# Patient Record
Sex: Female | Born: 1970
Health system: Southern US, Community
[De-identification: ages and names within clinical notes are randomized; demographics above are authoritative.]

## PROBLEM LIST (undated history)

## (undated) DIAGNOSIS — F32A Depression, unspecified: Secondary | ICD-10-CM

## (undated) HISTORY — DX: Depression, unspecified: F32.A

---

## 1998-03-17 ENCOUNTER — Encounter: Payer: Self-pay | Admitting: Neurosurgery

## 1998-03-17 ENCOUNTER — Ambulatory Visit (HOSPITAL_COMMUNITY): Admission: RE | Admit: 1998-03-17 | Discharge: 1998-03-17 | Payer: Self-pay | Admitting: Neurosurgery

## 1998-06-17 ENCOUNTER — Other Ambulatory Visit: Admission: RE | Admit: 1998-06-17 | Discharge: 1998-06-17 | Payer: Self-pay | Admitting: *Deleted

## 2010-01-31 ENCOUNTER — Encounter: Admission: RE | Admit: 2010-01-31 | Discharge: 2010-01-31 | Payer: Self-pay | Admitting: Obstetrics and Gynecology

## 2011-02-19 ENCOUNTER — Other Ambulatory Visit: Payer: Self-pay | Admitting: Obstetrics and Gynecology

## 2011-02-19 DIAGNOSIS — Z1231 Encounter for screening mammogram for malignant neoplasm of breast: Secondary | ICD-10-CM

## 2011-03-22 ENCOUNTER — Ambulatory Visit: Payer: Self-pay

## 2011-03-27 ENCOUNTER — Ambulatory Visit
Admission: RE | Admit: 2011-03-27 | Discharge: 2011-03-27 | Disposition: A | Discharge status: Home / Self Care | Payer: 59 | Source: Ambulatory Visit | Attending: Obstetrics and Gynecology | Admitting: Obstetrics and Gynecology

## 2011-03-27 DIAGNOSIS — Z1231 Encounter for screening mammogram for malignant neoplasm of breast: Secondary | ICD-10-CM

## 2012-01-04 ENCOUNTER — Other Ambulatory Visit: Payer: Self-pay | Admitting: Obstetrics and Gynecology

## 2012-01-04 DIAGNOSIS — Z1231 Encounter for screening mammogram for malignant neoplasm of breast: Secondary | ICD-10-CM

## 2012-03-27 ENCOUNTER — Ambulatory Visit
Admission: RE | Admit: 2012-03-27 | Discharge: 2012-03-27 | Disposition: A | Discharge status: Home / Self Care | Payer: 59 | Source: Ambulatory Visit | Attending: Obstetrics and Gynecology | Admitting: Obstetrics and Gynecology

## 2012-03-27 DIAGNOSIS — Z1231 Encounter for screening mammogram for malignant neoplasm of breast: Secondary | ICD-10-CM

## 2013-03-11 ENCOUNTER — Other Ambulatory Visit: Payer: Self-pay

## 2013-03-11 DIAGNOSIS — Z1231 Encounter for screening mammogram for malignant neoplasm of breast: Secondary | ICD-10-CM

## 2013-03-30 ENCOUNTER — Ambulatory Visit
Admission: RE | Admit: 2013-03-30 | Discharge: 2013-03-30 | Disposition: A | Discharge status: Home / Self Care | Payer: 59 | Source: Ambulatory Visit

## 2013-03-30 DIAGNOSIS — Z1231 Encounter for screening mammogram for malignant neoplasm of breast: Secondary | ICD-10-CM

## 2014-04-10 DIAGNOSIS — R110 Nausea: Secondary | ICD-10-CM | POA: Insufficient documentation

## 2014-04-10 DIAGNOSIS — R509 Fever, unspecified: Secondary | ICD-10-CM | POA: Diagnosis not present

## 2014-04-10 DIAGNOSIS — M791 Myalgia: Secondary | ICD-10-CM | POA: Insufficient documentation

## 2014-04-10 DIAGNOSIS — R198 Other specified symptoms and signs involving the digestive system and abdomen: Secondary | ICD-10-CM | POA: Insufficient documentation

## 2014-04-10 DIAGNOSIS — R51 Headache: Secondary | ICD-10-CM | POA: Diagnosis present

## 2014-04-10 DIAGNOSIS — H9209 Otalgia, unspecified ear: Secondary | ICD-10-CM | POA: Insufficient documentation

## 2014-04-11 ENCOUNTER — Encounter (HOSPITAL_COMMUNITY): Payer: Self-pay | Admitting: *Deleted

## 2014-04-11 ENCOUNTER — Emergency Department (HOSPITAL_COMMUNITY): Payer: 59

## 2014-04-11 ENCOUNTER — Emergency Department (HOSPITAL_COMMUNITY)
Admission: EM | Admit: 2014-04-11 | Discharge: 2014-04-11 | Disposition: A | Discharge status: Home / Self Care | Payer: 59 | Attending: Emergency Medicine | Admitting: Emergency Medicine

## 2014-04-11 DIAGNOSIS — R059 Cough, unspecified: Secondary | ICD-10-CM

## 2014-04-11 DIAGNOSIS — R69 Illness, unspecified: Secondary | ICD-10-CM

## 2014-04-11 DIAGNOSIS — R509 Fever, unspecified: Secondary | ICD-10-CM

## 2014-04-11 DIAGNOSIS — J111 Influenza due to unidentified influenza virus with other respiratory manifestations: Secondary | ICD-10-CM

## 2014-04-11 DIAGNOSIS — R197 Diarrhea, unspecified: Secondary | ICD-10-CM

## 2014-04-11 DIAGNOSIS — M791 Myalgia, unspecified site: Secondary | ICD-10-CM

## 2014-04-11 DIAGNOSIS — R05 Cough: Secondary | ICD-10-CM

## 2014-04-11 DIAGNOSIS — R110 Nausea: Secondary | ICD-10-CM

## 2014-04-11 LAB — BASIC METABOLIC PANEL
ANION GAP: 6 (ref 5–15)
BUN: 15 mg/dL (ref 6–23)
CALCIUM: 8.9 mg/dL (ref 8.4–10.5)
CHLORIDE: 112 mmol/L (ref 96–112)
CO2: 25 mmol/L (ref 19–32)
Creatinine, Ser: 0.73 mg/dL (ref 0.50–1.10)
GFR calc non Af Amer: >90 mL/min (ref >90)
Glucose, Bld: 97 mg/dL (ref 70–99)
Potassium: 3.7 mmol/L (ref 3.5–5.1)
SODIUM: 143 mmol/L (ref 135–145)

## 2014-04-11 LAB — CBC WITH DIFFERENTIAL/PLATELET
BASOS ABS: 0 10*3/uL (ref 0.0–0.1)
Basophils Relative: 0 % (ref 0–1)
Eosinophils Absolute: 0.1 10*3/uL (ref 0.0–0.7)
Eosinophils Relative: 2 % (ref 0–5)
HEMATOCRIT: 34 % — AB (ref 36.0–46.0)
Hemoglobin: 10.9 g/dL — ABNORMAL LOW (ref 12.0–15.0)
LYMPHS ABS: 2.1 10*3/uL (ref 0.7–4.0)
LYMPHS PCT: 31 % (ref 12–46)
MCH: 28.2 pg (ref 26.0–34.0)
MCHC: 32.1 g/dL (ref 30.0–36.0)
MCV: 87.9 fL (ref 78.0–100.0)
Monocytes Absolute: 0.6 10*3/uL (ref 0.1–1.0)
Monocytes Relative: 9 % (ref 3–12)
Neutro Abs: 4.1 10*3/uL (ref 1.7–7.7)
Neutrophils Relative %: 58 % (ref 43–77)
Platelets: 192 10*3/uL (ref 150–400)
RBC: 3.87 MIL/uL (ref 3.87–5.11)
RDW: 14.3 % (ref 11.5–15.5)
WBC: 6.9 10*3/uL (ref 4.0–10.5)

## 2014-04-11 MED ORDER — HYDROCODONE-ACETAMINOPHEN 5-325 MG PO TABS
1.0000 | ORAL_TABLET | Freq: Once | ORAL | Status: AC
  Administered 2014-04-11: 1 via ORAL
  Filled 2014-04-11: qty 1

## 2014-04-11 MED ORDER — ONDANSETRON HCL 4 MG PO TABS: 4.0000 mg | ORAL_TABLET | Freq: Four times a day (QID) | ORAL | Status: DC | PRN

## 2014-04-11 MED ORDER — HYDROCODONE-ACETAMINOPHEN 5-325 MG PO TABS: 1.0000 | ORAL_TABLET | ORAL | Status: DC | PRN

## 2014-04-11 MED ORDER — SODIUM CHLORIDE 0.9 % IV SOLN
1000.0000 mL | INTRAVENOUS | Status: DC
  Administered 2014-04-11: 1000 mL via INTRAVENOUS

## 2014-04-11 MED ORDER — ONDANSETRON HCL 4 MG/2ML IJ SOLN
4.0000 mg | Freq: Once | INTRAMUSCULAR | Status: AC
  Administered 2014-04-11: 4 mg via INTRAVENOUS
  Filled 2014-04-11: qty 2

## 2014-04-11 MED ORDER — IPRATROPIUM-ALBUTEROL 0.5-2.5 (3) MG/3ML IN SOLN
3.0000 mL | Freq: Once | RESPIRATORY_TRACT | Status: AC
Start: 2014-04-11 — End: 2014-04-11
  Administered 2014-04-11: 3 mL via RESPIRATORY_TRACT
  Filled 2014-04-11: qty 3

## 2014-04-11 MED ORDER — SODIUM CHLORIDE 0.9 % IV SOLN
1000.0000 mL | Freq: Once | INTRAVENOUS | Status: AC
Start: 2014-04-11 — End: 2014-04-11
  Administered 2014-04-11: 1000 mL via INTRAVENOUS

## 2014-04-11 NOTE — ED Notes (Signed)
Discharge instructions given, pt demonstrated teach back and verbal understanding. No concerns voiced.  

## 2014-04-11 NOTE — ED Notes (Signed)
Pt c/o cough, chills, headache, and n/v/d x 3 days.

## 2014-04-11 NOTE — Discharge Instructions (Signed)
Here symptoms seem to be from a virus, similar to the flu virus. Treatment is entirely asymptomatic. Drink plenty of fluids. Take acetaminophen or ibuprofen as needed for fever or aching. Take hydrocodone-acetaminophen as needed to suppress cough at night.  Cough, Adult  A cough is a reflex that helps clear your throat and airways. It can help heal the body or may be a reaction to an irritated airway. A cough may only last 2 or 3 weeks (acute) or may last more than 8 weeks (chronic).  CAUSES Acute cough:  Viral or bacterial infections. Chronic cough:  Infections.  Allergies.  Asthma.  Post-nasal drip.  Smoking.  Heartburn or acid reflux.  Some medicines.  Chronic lung problems (COPD).  Cancer. SYMPTOMS   Cough.  Fever.  Chest pain.  Increased breathing rate.  High-pitched whistling sound when breathing (wheezing).  Colored mucus that you cough up (sputum). TREATMENT   A bacterial cough may be treated with antibiotic medicine.  A viral cough must run its course and will not respond to antibiotics.  Your caregiver may recommend other treatments if you have a chronic cough. HOME CARE INSTRUCTIONS   Only take over-the-counter or prescription medicines for pain, discomfort, or fever as directed by your caregiver. Use cough suppressants only as directed by your caregiver.  Use a cold steam vaporizer or humidifier in your bedroom or home to help loosen secretions.  Sleep in a semi-upright position if your cough is worse at night.  Rest as needed.  Stop smoking if you smoke. SEEK IMMEDIATE MEDICAL CARE IF:   You have pus in your sputum.  Your cough starts to worsen.  You cannot control your cough with suppressants and are losing sleep.  You begin coughing up blood.  You have difficulty breathing.  You develop pain which is getting worse or is uncontrolled with medicine.  You have a fever. MAKE SURE YOU:   Understand these instructions.  Will watch  your condition.  Will get help right away if you are not doing well or get worse. Document Released: 08/25/2010 Document Revised: 05/21/2011 Document Reviewed: 08/25/2010 [facility] Patient Information 2015 [place], [place]. This information is not intended to replace advice given to you by your health care provider. Make sure you discuss any questions you have with your health care provider.  Nausea, Adult Nausea is the feeling that you have an upset stomach or have to vomit. Nausea by itself is not likely a serious concern, but it may be an early sign of more serious medical problems. As nausea gets worse, it can lead to vomiting. If vomiting develops, there is the risk of dehydration.  CAUSES   Viral infections.  Food poisoning.  Medicines.  Pregnancy.  Motion sickness.  Migraine headaches.  Emotional distress.  Severe pain from any source.  Alcohol intoxication. HOME CARE INSTRUCTIONS  Get plenty of rest.  Ask your caregiver about specific rehydration instructions.  Eat small amounts of food and sip liquids more often.  Take all medicines as told by your caregiver. SEEK MEDICAL CARE IF:  You have not improved after 2 days, or you get worse.  You have a headache. SEEK IMMEDIATE MEDICAL CARE IF:   You have a fever.  You faint.  You keep vomiting or have blood in your vomit.  You are extremely weak or dehydrated.  You have dark or bloody stools.  You have severe chest or abdominal pain. MAKE SURE YOU:  Understand these instructions.  Will watch your condition.  Will  get help right away if you are not doing well or get worse. Document Released: 04/05/2004 Document Revised: 11/21/2011 Document Reviewed: 11/08/2010 [facility] Patient Information 2015 [place], [place]. This information is not intended to replace advice given to you by your health care provider. Make sure you discuss any questions you have with your health care provider.  Diarrhea Diarrhea is  frequent loose and watery bowel movements. It can cause you to feel weak and dehydrated. Dehydration can cause you to become tired and thirsty, have a dry mouth, and have decreased urination that often is dark yellow. Diarrhea is a sign of another problem, most often an infection that will not last long. In most cases, diarrhea typically lasts 2-3 days. However, it can last longer if it is a sign of something more serious. It is important to treat your diarrhea as directed by your caregiver to lessen or prevent future episodes of diarrhea. CAUSES  Some common causes include:  Gastrointestinal infections caused by viruses, bacteria, or parasites.  Food poisoning or food allergies.  Certain medicines, such as antibiotics, chemotherapy, and laxatives.  Artificial sweeteners and fructose.  Digestive disorders. HOME CARE INSTRUCTIONS  Ensure adequate fluid intake (hydration): Have 1 cup (8 oz) of fluid for each diarrhea episode. Avoid fluids that contain simple sugars or sports drinks, fruit juices, whole milk products, and sodas. Your urine should be clear or pale yellow if you are drinking enough fluids. Hydrate with an oral rehydration solution that you can purchase at pharmacies, retail stores, and online. You can prepare an oral rehydration solution at home by mixing the following ingredients together:   - tsp table salt.   tsp baking soda.   tsp salt substitute containing potassium chloride.  1  tablespoons sugar.  1 L (34 oz) of water.  Certain foods and beverages may increase the speed at which food moves through the gastrointestinal (GI) tract. These foods and beverages should be avoided and include:  Caffeinated and alcoholic beverages.  High-fiber foods, such as raw fruits and vegetables, nuts, seeds, and whole grain breads and cereals.  Foods and beverages sweetened with sugar alcohols, such as xylitol, sorbitol, and mannitol.  Some foods may be well tolerated and may help  thicken stool including:  Starchy foods, such as rice, toast, pasta, low-sugar cereal, oatmeal, grits, baked potatoes, crackers, and bagels.  Bananas.  Applesauce.  Add probiotic-rich foods to help increase healthy bacteria in the GI tract, such as yogurt and fermented milk products.  Wash your hands well after each diarrhea episode.  Only take over-the-counter or prescription medicines as directed by your caregiver.  Take a warm bath to relieve any burning or pain from frequent diarrhea episodes. SEEK IMMEDIATE MEDICAL CARE IF:   You are unable to keep fluids down.  You have persistent vomiting.  You have blood in your stool, or your stools are black and tarry.  You do not urinate in 6-8 hours, or there is only a small amount of very dark urine.  You have abdominal pain that increases or localizes.  You have weakness, dizziness, confusion, or light-headedness.  You have a severe headache.  Your diarrhea gets worse or does not get better.  You have a fever or persistent symptoms for more than 2-3 days.  You have a fever and your symptoms suddenly get worse. MAKE SURE YOU:   Understand these instructions.  Will watch your condition.  Will get help right away if you are not doing well or get worse. Document Released:  02/16/2002 Document Revised: 07/13/2013 Document Reviewed: 11/04/2011 [facility] Patient Information 2015 [place], [place]. This information is not intended to replace advice given to you by your health care provider. Make sure you discuss any questions you have with your health care provider.  Ondansetron tablets What is this medicine? ONDANSETRON (on DAN se tron) is used to treat nausea and vomiting caused by chemotherapy. It is also used to prevent or treat nausea and vomiting after surgery. This medicine may be used for other purposes; ask your health care provider or pharmacist if you have questions. COMMON BRAND NAME(S): Zofran What should I tell my  health care provider before I take this medicine? They need to know if you have any of these conditions: -heart disease -history of irregular heartbeat -liver disease -low levels of magnesium or potassium in the blood -an unusual or allergic reaction to ondansetron, granisetron, other medicines, foods, dyes, or preservatives -pregnant or trying to get pregnant -breast-feeding How should I use this medicine? Take this medicine by mouth with a glass of water. Follow the directions on your prescription label. Take your doses at regular intervals. Do not take your medicine more often than directed. Talk to your pediatrician regarding the use of this medicine in children. Special care may be needed. Overdosage: If you think you have taken too much of this medicine contact a poison control center or emergency room at once. NOTE: This medicine is only for you. Do not share this medicine with others. What if I miss a dose? If you miss a dose, take it as soon as you can. If it is almost time for your next dose, take only that dose. Do not take double or extra doses. What may interact with this medicine? Do not take this medicine with any of the following medications: -apomorphine -certain medicines for fungal infections like fluconazole, itraconazole, ketoconazole, posaconazole, voriconazole -cisapride -dofetilide -dronedarone -pimozide -thioridazine -ziprasidone This medicine may also interact with the following medications: -carbamazepine -certain medicines for depression, anxiety, or psychotic disturbances -fentanyl -linezolid -MAOIs like Carbex, Eldepryl, Marplan, Nardil, and Parnate -methylene blue (injected into a vein) -other medicines that prolong the QT interval (cause an abnormal heart rhythm) -phenytoin -rifampicin -tramadol This list may not describe all possible interactions. Give your health care provider a list of all the medicines, herbs, non-prescription drugs, or dietary  supplements you use. Also tell them if you smoke, drink alcohol, or use illegal drugs. Some items may interact with your medicine. What should I watch for while using this medicine? Check with your doctor or health care professional right away if you have any sign of an allergic reaction. What side effects may I notice from receiving this medicine? Side effects that you should report to your doctor or health care professional as soon as possible: -allergic reactions like skin rash, itching or hives, swelling of the face, lips or tongue -breathing problems -confusion -dizziness -fast or irregular heartbeat -feeling faint or lightheaded, falls -fever and chills -loss of balance or coordination -seizures -sweating -swelling of the hands or feet -tightness in the chest -tremors -unusually weak or tired Side effects that usually do not require medical attention (report to your doctor or health care professional if they continue or are bothersome): -constipation or diarrhea -headache This list may not describe all possible side effects. Call your doctor for medical advice about side effects. You may report side effects to FDA at 1-800-FDA-1088. Where should I keep my medicine? Keep out of the reach of children. Store between  2 and 30 degrees C (36 and 86 degrees F). Throw away any unused medicine after the expiration date. NOTE: This sheet is a summary. It may not cover all possible information. If you have questions about this medicine, talk to your doctor, pharmacist, or health care provider.  2015, [employer] Standard. (2012-12-03 16:27:45)

## 2014-04-11 NOTE — ED Provider Notes (Signed)
CSN: [id number]     Arrival date & time 04/10/14  2341 History  This chart was scribed for Delora Fuel, MD by Jeanell Sparrow, ED Scribe. This patient was seen in room APA14/APA14 and the patient's care was started at 12:11 AM.     No chief complaint on file.  The history is provided by the patient. No language interpreter was used.   HPI Comments: Rebecca Harris is a 44 y.o. female who presents to the Emergency Department complaining of a constant moderate right sided headache that started today. She reports that had a a moderate intermittent dry cough that started 3 days ago. She states that then today she had a subjective fever, nausea, 2 episodes of diarrhea, right ear pain, and generalized myalgias. She currently rates the severity of her headache as a 3/10. She states that she took tylenol without any relief. She denies any chills, diaphoresis, or vomiting.   History reviewed. No pertinent past medical history. Past Surgical History  Procedure Laterality Date  . Cesarean section      x 3    History reviewed. No pertinent family history. History  Substance Use Topics  . Smoking status: Never Smoker   . Smokeless tobacco: Not on file  . Alcohol Use: No   OB History    No data available     Review of Systems  Constitutional: Positive for fever. Negative for chills and diaphoresis.  HENT: Positive for ear pain.   Respiratory: Positive for cough.   Gastrointestinal: Positive for nausea and diarrhea. Negative for vomiting.  Musculoskeletal: Positive for myalgias (generalized).  Neurological: Positive for headaches.  All other systems reviewed and are negative.   Allergies  Phenergan  Home Medications   Prior to Admission medications   Not on File   BP 102/59 mmHg  Pulse 83  Temp(Src) 98.5 F (36.9 C) (Oral)  Ht 4\' 9"  (1.448 m)  Wt 152 lb (68.947 kg)  BMI 32.88 kg/m2  SpO2 100%  LMP 03/28/2014 Physical Exam  Constitutional: She is oriented to person, place, and  time. She appears well-developed and well-nourished. No distress.  HENT:  Head: Normocephalic and atraumatic.  Mild TTP over the frontal and maxillary sinuses. No nasal discharge. TMs are clear.   Neck: Neck supple. No tracheal deviation present.  Cardiovascular: Normal rate.   Pulmonary/Chest: Effort normal. No respiratory distress. She has rales.  Rales present at the left base.   Abdominal: Soft. There is no tenderness. There is no rebound and no guarding.  Decreased bowel sounds.   Musculoskeletal: Normal range of motion.  Neurological: She is alert and oriented to person, place, and time.  Skin: Skin is warm and dry.  Psychiatric: She has a normal mood and affect. Her behavior is normal.  Nursing note and vitals reviewed.   ED Course  Procedures (including critical care time) DIAGNOSTIC STUDIES: Oxygen Saturation is 100% on RA, normal by my interpretation.    COORDINATION OF CARE: 12:15 AM- Pt advised of plan for treatment which includes medication, radiology, and labs and pt agrees.  Labs Review Results for orders placed or performed during the hospital encounter of 95/18/84  Basic metabolic panel  Result Value Ref Range   Sodium 143 135 - 145 mmol/L   Potassium 3.7 3.5 - 5.1 mmol/L   Chloride 112 96 - 112 mmol/L   CO2 25 19 - 32 mmol/L   Glucose, Bld 97 70 - 99 mg/dL   BUN 15 6 - 23 mg/dL  Creatinine, Ser 0.73 0.50 - 1.10 mg/dL   Calcium 8.9 8.4 - 10.5 mg/dL   GFR calc non Af Amer >90 >90 mL/min   GFR calc Af Amer >90 >90 mL/min   Anion gap 6 5 - 15  CBC with Differential  Result Value Ref Range   WBC 6.9 4.0 - 10.5 K/uL   RBC 3.87 3.87 - 5.11 MIL/uL   Hemoglobin 10.9 (L) 12.0 - 15.0 g/dL   HCT 34.0 (L) 36.0 - 46.0 %   MCV 87.9 78.0 - 100.0 fL   MCH 28.2 26.0 - 34.0 pg   MCHC 32.1 30.0 - 36.0 g/dL   RDW 14.3 11.5 - 15.5 %   Platelets 192 150 - 400 K/uL   Neutrophils Relative % 58 43 - 77 %   Neutro Abs 4.1 1.7 - 7.7 K/uL   Lymphocytes Relative 31 12 - 46 %    Lymphs Abs 2.1 0.7 - 4.0 K/uL   Monocytes Relative 9 3 - 12 %   Monocytes Absolute 0.6 0.1 - 1.0 K/uL   Eosinophils Relative 2 0 - 5 %   Eosinophils Absolute 0.1 0.0 - 0.7 K/uL   Basophils Relative 0 0 - 1 %   Basophils Absolute 0.0 0.0 - 0.1 K/uL   Imaging Review Dg Chest 2 View  04/11/2014   CLINICAL DATA:  Moderate intermittent dry cough starting 3 days ago. Fever. Diarrhea. Right ear pain. Generalized myalgia.  EXAM: CHEST  2 VIEW  COMPARISON:  None.  FINDINGS: Peripherally calcified centrally lucent expansile bone lesions demonstrated in the right fourth and fifth ribs. 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 Aneurysmal bone cyst, metastasis/myeloma, or enchondroma could also have this appearance. Correlation with any old outside chest radiographs would be helpful to look for any interval change.  Normal heart size and pulmonary vascularity. No focal airspace disease or consolidation in the lungs. No blunting of costophrenic angles. No pneumothorax. Mediastinal contours appear intact.  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 Correlation with any old outside films is suggested. No evidence of active pulmonary disease.   Electronically Signed   By: Lucienne Capers M.D.   On: 04/11/2014 02:22     MDM   Final diagnoses:  Fever, unspecified fever cause  Cough  Influenza-like illness  Nausea  Diarrhea  Myalgia    Flulike illness with cough, fever, body aches. Also associated diarrhea and nausea. She is given IV hydration and ondansetron and feels much better. Chest x-ray shows no evidence of pneumonia. She is discharged with prescription for ondansetron. She is complaining of persistent, dry cough. She was given a therapeutic trial of albuterol with ipratropium with no relief of cough. She is given  a take-home pack of hydrocodone-acetaminophen to use to suppress cough at night.  I personally performed the services described in this documentation, which was scribed in my presence. The recorded information has been reviewed and is accurate.      Delora Fuel, MD 90/30/09 2330

## 2014-04-14 MED FILL — Ondansetron HCl Tab 4 MG: ORAL | Qty: 4 | Status: AC

## 2014-04-14 MED FILL — Hydrocodone-Acetaminophen Tab 5-325 MG: ORAL | Qty: 6 | Status: AC

## 2015-03-13 HISTORY — PX: FOOT SURGERY: SHX648

## 2015-04-04 DIAGNOSIS — F329 Major depressive disorder, single episode, unspecified: Secondary | ICD-10-CM | POA: Diagnosis not present

## 2015-04-04 MED FILL — VENLAFAXINE HCL ER 37.5 MG: 37.5 | 33 days supply | Qty: 60 | Fill #0

## 2015-04-12 DIAGNOSIS — Z6832 Body mass index (BMI) 32.0-32.9, adult: Secondary | ICD-10-CM | POA: Diagnosis not present

## 2015-04-12 DIAGNOSIS — Z1151 Encounter for screening for human papillomavirus (HPV): Secondary | ICD-10-CM | POA: Diagnosis not present

## 2015-04-12 DIAGNOSIS — Z1389 Encounter for screening for other disorder: Secondary | ICD-10-CM | POA: Diagnosis not present

## 2015-04-12 DIAGNOSIS — Z130 Encounter for screening for diseases of the blood and blood-forming organs and certain disorders involving the immune mechanism: Secondary | ICD-10-CM | POA: Diagnosis not present

## 2015-04-12 DIAGNOSIS — Z124 Encounter for screening for malignant neoplasm of cervix: Secondary | ICD-10-CM | POA: Diagnosis not present

## 2015-04-12 DIAGNOSIS — Z01419 Encounter for gynecological examination (general) (routine) without abnormal findings: Secondary | ICD-10-CM | POA: Diagnosis not present

## 2015-04-19 DIAGNOSIS — Z1231 Encounter for screening mammogram for malignant neoplasm of breast: Secondary | ICD-10-CM | POA: Diagnosis not present

## 2015-05-04 MED FILL — VENLAFAXINE HCL ER 75 MG CA: 75 | 30 days supply | Qty: 30 | Fill #0

## 2015-05-13 DIAGNOSIS — F329 Major depressive disorder, single episode, unspecified: Secondary | ICD-10-CM | POA: Diagnosis not present

## 2015-06-10 DIAGNOSIS — F329 Major depressive disorder, single episode, unspecified: Secondary | ICD-10-CM | POA: Diagnosis not present

## 2015-06-15 MED FILL — VENLAFAXINE HCL ER 75 MG CA: 75 | 30 days supply | Qty: 30 | Fill #0

## 2015-07-27 MED FILL — VENLAFAXINE HCL ER 75 MG CA: 75 | 30 days supply | Qty: 30 | Fill #1

## 2015-12-30 DIAGNOSIS — M79674 Pain in right toe(s): Secondary | ICD-10-CM | POA: Diagnosis not present

## 2015-12-30 DIAGNOSIS — B079 Viral wart, unspecified: Secondary | ICD-10-CM | POA: Diagnosis not present

## 2015-12-30 DIAGNOSIS — M2041 Other hammer toe(s) (acquired), right foot: Secondary | ICD-10-CM | POA: Diagnosis not present

## 2016-01-13 DIAGNOSIS — M2041 Other hammer toe(s) (acquired), right foot: Secondary | ICD-10-CM | POA: Diagnosis not present

## 2016-01-13 DIAGNOSIS — Z79899 Other long term (current) drug therapy: Secondary | ICD-10-CM | POA: Diagnosis not present

## 2016-01-13 DIAGNOSIS — M71571 Other bursitis, not elsewhere classified, right ankle and foot: Secondary | ICD-10-CM | POA: Diagnosis not present

## 2016-01-31 DIAGNOSIS — M2041 Other hammer toe(s) (acquired), right foot: Secondary | ICD-10-CM | POA: Diagnosis not present

## 2016-02-06 DIAGNOSIS — M25571 Pain in right ankle and joints of right foot: Secondary | ICD-10-CM | POA: Diagnosis not present

## 2016-02-13 MED FILL — CEPHALEXIN 500 MG CAPSULE: 500 | 7 days supply | Qty: 14 | Fill #0

## 2016-02-17 DIAGNOSIS — M25571 Pain in right ankle and joints of right foot: Secondary | ICD-10-CM | POA: Diagnosis not present

## 2016-03-15 DIAGNOSIS — M25571 Pain in right ankle and joints of right foot: Secondary | ICD-10-CM | POA: Diagnosis not present

## 2016-04-24 DIAGNOSIS — Z6831 Body mass index (BMI) 31.0-31.9, adult: Secondary | ICD-10-CM | POA: Diagnosis not present

## 2016-04-24 DIAGNOSIS — Z01419 Encounter for gynecological examination (general) (routine) without abnormal findings: Secondary | ICD-10-CM | POA: Diagnosis not present

## 2016-04-24 DIAGNOSIS — Z1231 Encounter for screening mammogram for malignant neoplasm of breast: Secondary | ICD-10-CM | POA: Diagnosis not present

## 2016-04-24 DIAGNOSIS — Z130 Encounter for screening for diseases of the blood and blood-forming organs and certain disorders involving the immune mechanism: Secondary | ICD-10-CM | POA: Diagnosis not present

## 2016-04-24 DIAGNOSIS — Z1389 Encounter for screening for other disorder: Secondary | ICD-10-CM | POA: Diagnosis not present

## 2016-09-11 MED FILL — ESCITALOPRAM 20 MG TABLET: 20 | 30 days supply | Qty: 30 | Fill #0

## 2016-10-16 MED FILL — ESCITALOPRAM 20 MG TABLET: 20 | 30 days supply | Qty: 30 | Fill #1

## 2016-11-21 MED FILL — ESCITALOPRAM 20 MG TABLET: 20 | 30 days supply | Qty: 30 | Fill #2

## 2017-08-14 DIAGNOSIS — Z1389 Encounter for screening for other disorder: Secondary | ICD-10-CM | POA: Diagnosis not present

## 2017-08-14 DIAGNOSIS — Z01419 Encounter for gynecological examination (general) (routine) without abnormal findings: Secondary | ICD-10-CM | POA: Diagnosis not present

## 2017-08-14 DIAGNOSIS — Z6833 Body mass index (BMI) 33.0-33.9, adult: Secondary | ICD-10-CM | POA: Diagnosis not present

## 2017-08-14 DIAGNOSIS — Z1231 Encounter for screening mammogram for malignant neoplasm of breast: Secondary | ICD-10-CM | POA: Diagnosis not present

## 2018-10-31 DIAGNOSIS — Z124 Encounter for screening for malignant neoplasm of cervix: Secondary | ICD-10-CM | POA: Diagnosis not present

## 2018-10-31 DIAGNOSIS — Z130 Encounter for screening for diseases of the blood and blood-forming organs and certain disorders involving the immune mechanism: Secondary | ICD-10-CM | POA: Diagnosis not present

## 2018-10-31 DIAGNOSIS — Z1211 Encounter for screening for malignant neoplasm of colon: Secondary | ICD-10-CM | POA: Diagnosis not present

## 2018-10-31 DIAGNOSIS — Z1389 Encounter for screening for other disorder: Secondary | ICD-10-CM | POA: Diagnosis not present

## 2018-10-31 DIAGNOSIS — Z01419 Encounter for gynecological examination (general) (routine) without abnormal findings: Secondary | ICD-10-CM | POA: Diagnosis not present

## 2018-10-31 DIAGNOSIS — Z1231 Encounter for screening mammogram for malignant neoplasm of breast: Secondary | ICD-10-CM | POA: Diagnosis not present

## 2018-10-31 DIAGNOSIS — Z6833 Body mass index (BMI) 33.0-33.9, adult: Secondary | ICD-10-CM | POA: Diagnosis not present

## 2019-01-15 ENCOUNTER — Encounter: Payer: Self-pay | Admitting: Obstetrics and Gynecology

## 2019-01-19 ENCOUNTER — Encounter: Payer: Self-pay | Admitting: Gastroenterology

## 2019-01-28 ENCOUNTER — Other Ambulatory Visit: Payer: Self-pay

## 2019-01-28 ENCOUNTER — Encounter: Payer: Self-pay | Admitting: Gastroenterology

## 2019-01-28 ENCOUNTER — Ambulatory Visit (AMBULATORY_SURGERY_CENTER): Payer: 59 | Admitting: *Deleted

## 2019-01-28 VITALS — Temp 97.3°F | Ht <= 58 in | Wt 156.0 lb

## 2019-01-28 DIAGNOSIS — Z1211 Encounter for screening for malignant neoplasm of colon: Secondary | ICD-10-CM

## 2019-01-28 DIAGNOSIS — Z1159 Encounter for screening for other viral diseases: Secondary | ICD-10-CM

## 2019-01-28 MED ORDER — SUPREP BOWEL PREP KIT 17.5-3.13-1.6 GM/177ML PO SOLN: 1.0000 | Freq: Once | ORAL | 0 refills | Status: AC

## 2019-01-28 MED FILL — SUPREP BOWEL PREP KIT: 17.5-3.13-1 | 1 days supply | Qty: 354 | Fill #0

## 2019-01-28 NOTE — Progress Notes (Signed)
No egg or soy allergy known to patient  No issues with past sedation with any surgeries  or procedures, no intubation problems  No diet pills per patient No home 02 use per patient  No blood thinners per patient  Pt denies issues with constipation  No A fib or A flutter  EMMI video sent to pt's e mail   cov test 11-20 Friday 810  Due to the COVID-19 pandemic we are asking patients to follow these guidelines. Please only bring one care partner. Please be aware that your care partner may wait in the car in the parking lot or if they feel like they will be too hot to wait in the car, they may wait in the lobby on the 4th floor. All care partners are required to wear a mask the entire time (we do not have any that we can provide them), they need to practice social distancing, and we will do a Covid check for all patient's and care partners when you arrive. Also we will check their temperature and your temperature. If the care partner waits in their car they need to stay in the parking lot the entire time and we will call them on their cell phone when the patient is ready for discharge so they can bring the car to the front of the building. Also all patient's will need to wear a mask into building.

## 2019-01-30 ENCOUNTER — Ambulatory Visit (INDEPENDENT_AMBULATORY_CARE_PROVIDER_SITE_OTHER): Payer: 59

## 2019-01-30 DIAGNOSIS — Z1159 Encounter for screening for other viral diseases: Secondary | ICD-10-CM | POA: Diagnosis not present

## 2019-01-30 LAB — SARS CORONAVIRUS 2 (TAT 6-24 HRS): SARS Coronavirus 2: NEGATIVE

## 2019-02-02 ENCOUNTER — Other Ambulatory Visit: Payer: Self-pay

## 2019-02-02 ENCOUNTER — Encounter: Payer: Self-pay | Admitting: Gastroenterology

## 2019-02-02 ENCOUNTER — Ambulatory Visit (AMBULATORY_SURGERY_CENTER): Payer: 59 | Admitting: Gastroenterology

## 2019-02-02 VITALS — BP 108/57 | HR 69 | Temp 98.8°F | Resp 10 | Ht <= 58 in | Wt 156.0 lb

## 2019-02-02 DIAGNOSIS — K635 Polyp of colon: Secondary | ICD-10-CM

## 2019-02-02 DIAGNOSIS — Z1211 Encounter for screening for malignant neoplasm of colon: Secondary | ICD-10-CM

## 2019-02-02 DIAGNOSIS — D120 Benign neoplasm of cecum: Secondary | ICD-10-CM

## 2019-02-02 DIAGNOSIS — D125 Benign neoplasm of sigmoid colon: Secondary | ICD-10-CM

## 2019-02-02 MED ORDER — SODIUM CHLORIDE 0.9 % IV SOLN: 500.0000 mL | Freq: Once | INTRAVENOUS | Status: DC

## 2019-02-02 NOTE — Op Note (Signed)
[facility] Patient Name: Rebecca Harris Procedure Date: 02/02/2019 11:24 AM MRN: [id number] Endoscopist: Remo Lipps P. Havery Moros , MD Age: 48 Referring MD:  Date of Birth: 1971/01/01 Gender: Female Account #: [id number] Procedure:                Colonoscopy Indications:              Screening for colorectal malignant neoplasm, This                            is the patient's first colonoscopy Medicines:                Monitored Anesthesia Care Procedure:                Pre-Anesthesia Assessment:                           - Prior to the procedure, a History and Physical                            was performed, and patient medications and                            allergies were reviewed. The patient's tolerance of                            previous anesthesia was also reviewed. The risks                            and benefits of the procedure and the sedation                            options and risks were discussed with the patient.                            All questions were answered, and informed consent                            was obtained. Prior Anticoagulants: The patient has                            taken no previous anticoagulant or antiplatelet                            agents. ASA Grade Assessment: II - A patient with                            mild systemic disease. After reviewing the risks                            and benefits, the patient was deemed in                            satisfactory condition to undergo the procedure.  After obtaining informed consent, the colonoscope                            was passed under direct vision. Throughout the                            procedure, the patient's blood pressure, pulse, and                            oxygen saturations were monitored continuously. The                            Colonoscope was introduced through the anus and                            advanced to the  the cecum, identified by                            appendiceal orifice and ileocecal valve. The                            colonoscopy was performed without difficulty. The                            patient tolerated the procedure well. The quality                            of the bowel preparation was good. The ileocecal                            valve, appendiceal orifice, and rectum were                            photographed. Scope In: 11:26:59 AM Scope Out: 11:51:45 AM Scope Withdrawal Time: 0 hours 21 minutes 12 seconds  Total Procedure Duration: 0 hours 24 minutes 46 seconds  Findings:                 The perianal and digital rectal examinations were                            normal.                           A diminutive polyp was found in the cecum. The                            polyp was flat. The polyp was removed with a cold                            snare. Resection and retrieval were complete.                           A few small-mouthed diverticula were found in the  sigmoid colon.                           The colon was tortuous.                           A 4 mm polyp was found in the recto-sigmoid colon.                            The polyp was sessile. The polyp was removed with a                            cold snare. Resection and retrieval were complete.                           The exam was otherwise without abnormality. Of                            note, due to small size of rectum, retroflexed                            views of the rectum were not obtained. Complications:            No immediate complications. Estimated blood loss:                            Minimal. Estimated Blood Loss:     Estimated blood loss was minimal. Impression:               - One diminutive polyp in the cecum, removed with a                            cold snare. Resected and retrieved.                           - Diverticulosis in the sigmoid  colon.                           - Tortuous colon.                           - One 4 mm polyp at the recto-sigmoid colon,                            removed with a cold snare. Resected and retrieved.                           - The examination was otherwise normal. Recommendation:           - Patient has a contact number available for                            emergencies. The signs and symptoms of potential  delayed complications were discussed with the                            patient. Return to normal activities tomorrow.                            Written discharge instructions were provided to the                            patient.                           - Resume previous diet.                           - Continue present medications.                           - Await pathology results. Remo Lipps P. Debra Colon, MD 02/02/2019 11:56:51 AM This report has been signed electronically.

## 2019-02-02 NOTE — Progress Notes (Signed)
VS- Rebecca Harris Temperature-Lisa Clapps  Pt's states no medical or surgical changes since previsit or office visit.

## 2019-02-02 NOTE — Patient Instructions (Signed)
Handouts include: Polyps and Diverticulosis    YOU HAD AN ENDOSCOPIC PROCEDURE TODAY AT [facility]:   Refer to the procedure report that was given to you for any specific questions about what was found during the examination.  If the procedure report does not answer your questions, please call your gastroenterologist to clarify.  If you requested that your care partner not be given the details of your procedure findings, then the procedure report has been included in a sealed envelope for you to review at your convenience later.  YOU SHOULD EXPECT: Some feelings of bloating in the abdomen. Passage of more gas than usual.  Walking can help get rid of the air that was put into your GI tract during the procedure and reduce the bloating. If you had a lower endoscopy (such as a colonoscopy or flexible sigmoidoscopy) you may notice spotting of blood in your stool or on the toilet paper. If you underwent a bowel prep for your procedure, you may not have a normal bowel movement for a few days.  Please Note:  You might notice some irritation and congestion in your nose or some drainage.  This is from the oxygen used during your procedure.  There is no need for concern and it should clear up in a day or so.  SYMPTOMS TO REPORT IMMEDIATELY:   Following lower endoscopy (colonoscopy or flexible sigmoidoscopy):  Excessive amounts of blood in the stool  Significant tenderness or worsening of abdominal pains  Swelling of the abdomen that is new, acute  Fever of 100F or higher   For urgent or emergent issues, a gastroenterologist can be reached at any hour by calling [phone].   DIET:  We do recommend a small meal at first, but then you may proceed to your regular diet.  Drink plenty of fluids but you should avoid alcoholic beverages for 24 hours.  ACTIVITY:  You should plan to take it easy for the rest of today and you should NOT DRIVE or use heavy machinery until tomorrow (because  of the sedation medicines used during the test).    FOLLOW UP: Our staff will call the number listed on your records 48-72 hours following your procedure to check on you and address any questions or concerns that you may have regarding the information given to you following your procedure. If we do not reach you, we will leave a message.  We will attempt to reach you two times.  During this call, we will ask if you have developed any symptoms of COVID 19. If you develop any symptoms (ie: fever, flu-like symptoms, shortness of breath, cough etc.) before then, please call [phone].  If you test positive for Covid 19 in the 2 weeks post procedure, please call and report this information to [place].    If any biopsies were taken you will be contacted by phone or by letter within the next 1-3 weeks.  Please call us at [phone] if you have not heard about the biopsies in 3 weeks.    SIGNATURES/CONFIDENTIALITY: You and/or your care partner have signed paperwork which will be entered into your electronic medical record.  These signatures attest to the fact that that the information above on your After Visit Summary has been reviewed and is understood.  Full responsibility of the confidentiality of this discharge information lies with you and/or your care-partner.

## 2019-02-02 NOTE — Progress Notes (Signed)
Pt tolerated well. VSS. Stable to recovery.

## 2019-02-04 ENCOUNTER — Telehealth: Payer: Self-pay

## 2019-02-04 NOTE — Telephone Encounter (Signed)
1st follow up call made.  NALM 

## 2019-11-02 DIAGNOSIS — Z1231 Encounter for screening mammogram for malignant neoplasm of breast: Secondary | ICD-10-CM | POA: Diagnosis not present

## 2019-11-02 DIAGNOSIS — Z130 Encounter for screening for diseases of the blood and blood-forming organs and certain disorders involving the immune mechanism: Secondary | ICD-10-CM | POA: Diagnosis not present

## 2019-11-02 DIAGNOSIS — Z1389 Encounter for screening for other disorder: Secondary | ICD-10-CM | POA: Diagnosis not present

## 2019-11-02 DIAGNOSIS — Z6833 Body mass index (BMI) 33.0-33.9, adult: Secondary | ICD-10-CM | POA: Diagnosis not present

## 2019-11-02 DIAGNOSIS — Z01419 Encounter for gynecological examination (general) (routine) without abnormal findings: Secondary | ICD-10-CM | POA: Diagnosis not present

## 2019-11-06 ENCOUNTER — Other Ambulatory Visit: Payer: Self-pay | Admitting: Obstetrics and Gynecology

## 2019-11-06 DIAGNOSIS — R928 Other abnormal and inconclusive findings on diagnostic imaging of breast: Secondary | ICD-10-CM

## 2019-11-20 DIAGNOSIS — Z1152 Encounter for screening for COVID-19: Secondary | ICD-10-CM | POA: Diagnosis not present

## 2019-11-20 DIAGNOSIS — Z03818 Encounter for observation for suspected exposure to other biological agents ruled out: Secondary | ICD-10-CM | POA: Diagnosis not present

## 2019-11-24 ENCOUNTER — Other Ambulatory Visit: Payer: Self-pay | Admitting: Obstetrics and Gynecology

## 2019-11-24 ENCOUNTER — Ambulatory Visit
Admission: RE | Admit: 2019-11-24 | Discharge: 2019-11-24 | Disposition: A | Discharge status: Home / Self Care | Payer: 59 | Source: Ambulatory Visit | Attending: Obstetrics and Gynecology | Admitting: Obstetrics and Gynecology

## 2019-11-24 ENCOUNTER — Other Ambulatory Visit: Payer: Self-pay

## 2019-11-24 ENCOUNTER — Other Ambulatory Visit: Payer: 59

## 2019-11-24 DIAGNOSIS — N6001 Solitary cyst of right breast: Secondary | ICD-10-CM | POA: Diagnosis not present

## 2019-11-24 DIAGNOSIS — R922 Inconclusive mammogram: Secondary | ICD-10-CM | POA: Diagnosis not present

## 2019-11-24 DIAGNOSIS — R928 Other abnormal and inconclusive findings on diagnostic imaging of breast: Secondary | ICD-10-CM

## 2020-02-23 ENCOUNTER — Other Ambulatory Visit: Payer: 59

## 2020-06-15 DIAGNOSIS — H43812 Vitreous degeneration, left eye: Secondary | ICD-10-CM | POA: Diagnosis not present

## 2020-07-21 ENCOUNTER — Other Ambulatory Visit (HOSPITAL_COMMUNITY): Payer: Self-pay

## 2020-07-21 MED ORDER — AMOXICILLIN 500 MG PO CAPS
ORAL_CAPSULE | ORAL | 1 refills | Status: DC
  Filled 2020-07-21: qty 30, 9d supply, fill #0

## 2020-11-03 ENCOUNTER — Other Ambulatory Visit: Payer: Self-pay | Admitting: Obstetrics and Gynecology

## 2020-11-03 DIAGNOSIS — Z124 Encounter for screening for malignant neoplasm of cervix: Secondary | ICD-10-CM | POA: Diagnosis not present

## 2020-11-03 DIAGNOSIS — Z130 Encounter for screening for diseases of the blood and blood-forming organs and certain disorders involving the immune mechanism: Secondary | ICD-10-CM | POA: Diagnosis not present

## 2020-11-03 DIAGNOSIS — Z1389 Encounter for screening for other disorder: Secondary | ICD-10-CM | POA: Diagnosis not present

## 2020-11-03 DIAGNOSIS — N6310 Unspecified lump in the right breast, unspecified quadrant: Secondary | ICD-10-CM

## 2020-11-03 DIAGNOSIS — Z1151 Encounter for screening for human papillomavirus (HPV): Secondary | ICD-10-CM | POA: Diagnosis not present

## 2020-11-03 DIAGNOSIS — Z6833 Body mass index (BMI) 33.0-33.9, adult: Secondary | ICD-10-CM | POA: Diagnosis not present

## 2020-11-03 DIAGNOSIS — Z01419 Encounter for gynecological examination (general) (routine) without abnormal findings: Secondary | ICD-10-CM | POA: Diagnosis not present

## 2020-11-03 LAB — RESULTS CONSOLE HPV: CHL HPV: NEGATIVE

## 2020-11-03 LAB — HM PAP SMEAR

## 2020-12-14 ENCOUNTER — Ambulatory Visit
Admission: RE | Admit: 2020-12-14 | Discharge: 2020-12-14 | Disposition: A | Discharge status: Home / Self Care | Payer: 59 | Source: Ambulatory Visit | Attending: Obstetrics and Gynecology | Admitting: Obstetrics and Gynecology

## 2020-12-14 ENCOUNTER — Other Ambulatory Visit: Payer: Self-pay

## 2020-12-14 DIAGNOSIS — N6001 Solitary cyst of right breast: Secondary | ICD-10-CM | POA: Diagnosis not present

## 2020-12-14 DIAGNOSIS — R922 Inconclusive mammogram: Secondary | ICD-10-CM | POA: Diagnosis not present

## 2020-12-14 DIAGNOSIS — N6310 Unspecified lump in the right breast, unspecified quadrant: Secondary | ICD-10-CM

## 2021-12-12 ENCOUNTER — Other Ambulatory Visit (HOSPITAL_COMMUNITY): Payer: Self-pay

## 2021-12-12 MED ORDER — SULFAMETHOXAZOLE-TRIMETHOPRIM 800-160 MG PO TABS
1.0000 | ORAL_TABLET | Freq: Two times a day (BID) | ORAL | 0 refills | Status: DC
  Filled 2021-12-12: qty 10, 5d supply, fill #0

## 2021-12-13 ENCOUNTER — Other Ambulatory Visit: Payer: Self-pay | Admitting: Obstetrics and Gynecology

## 2021-12-13 DIAGNOSIS — N644 Mastodynia: Secondary | ICD-10-CM

## 2021-12-26 ENCOUNTER — Ambulatory Visit
Admission: RE | Admit: 2021-12-26 | Discharge: 2021-12-26 | Disposition: A | Discharge status: Home / Self Care | Payer: 59 | Source: Ambulatory Visit | Attending: Obstetrics and Gynecology | Admitting: Obstetrics and Gynecology

## 2021-12-26 ENCOUNTER — Ambulatory Visit
Admission: RE | Admit: 2021-12-26 | Discharge: 2021-12-26 | Disposition: A | Discharge status: Home / Self Care | Payer: No Typology Code available for payment source | Source: Ambulatory Visit | Attending: Obstetrics and Gynecology | Admitting: Obstetrics and Gynecology

## 2021-12-26 DIAGNOSIS — N644 Mastodynia: Secondary | ICD-10-CM

## 2021-12-26 LAB — HM MAMMOGRAPHY

## 2022-01-17 IMAGING — MG DIGITAL DIAGNOSTIC BILAT W/ TOMO W/ CAD
6 of 10 series · 6 of 30 positions shown · non-contrast
Comparison: Previous exam(s).

CLINICAL DATA: Follow-up right breast probable sebaceous cyst.

EXAM:
DIGITAL DIAGNOSTIC BILATERAL MAMMOGRAM WITH TOMOSYNTHESIS AND CAD;
ULTRASOUND RIGHT BREAST LIMITED
TECHNIQUE: Bilateral digital diagnostic mammography and breast tomosynthesis
was performed. The images were evaluated with computer-aided
detection.; Targeted ultrasound examination of the right breast was
performed

[L MLO synth-2D]
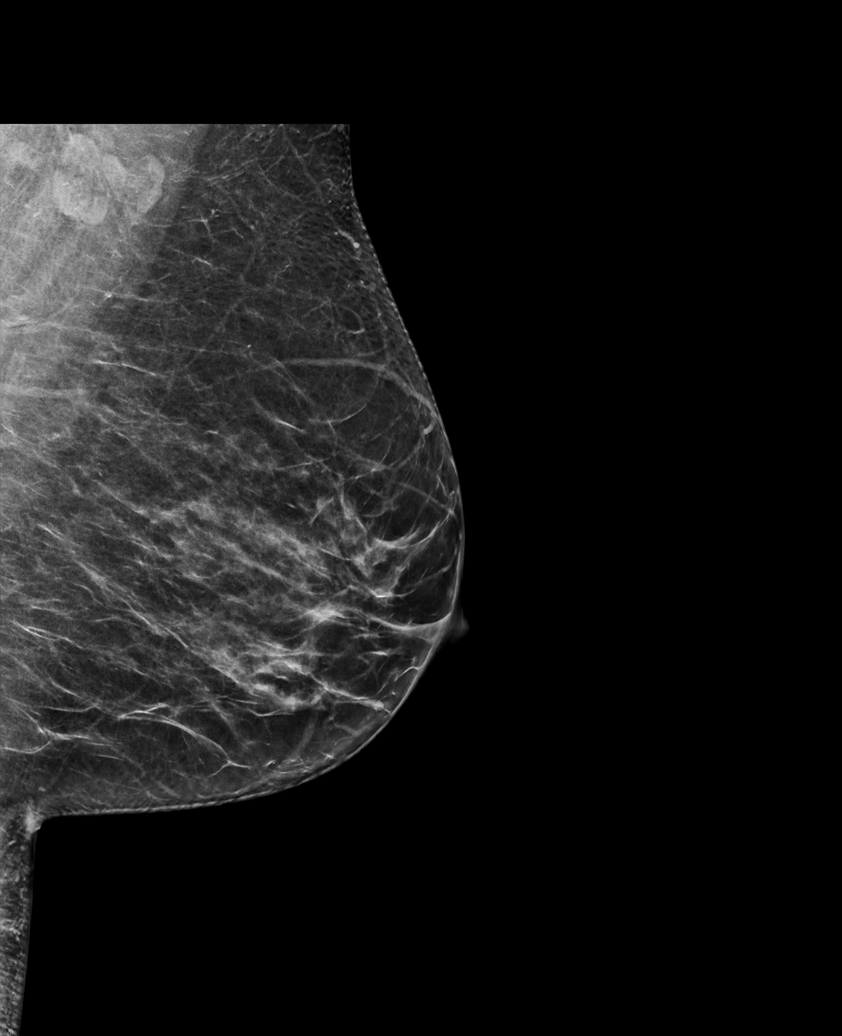

[R CC synth-2D]
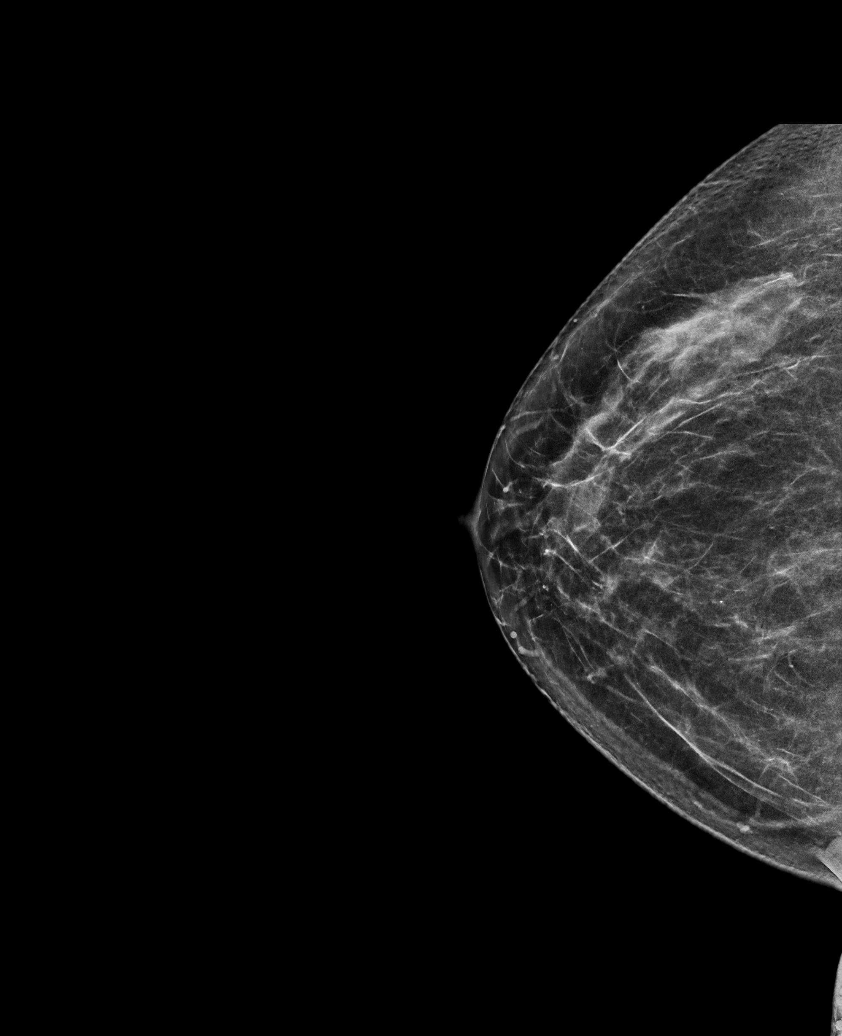

[L CC synth-2D]
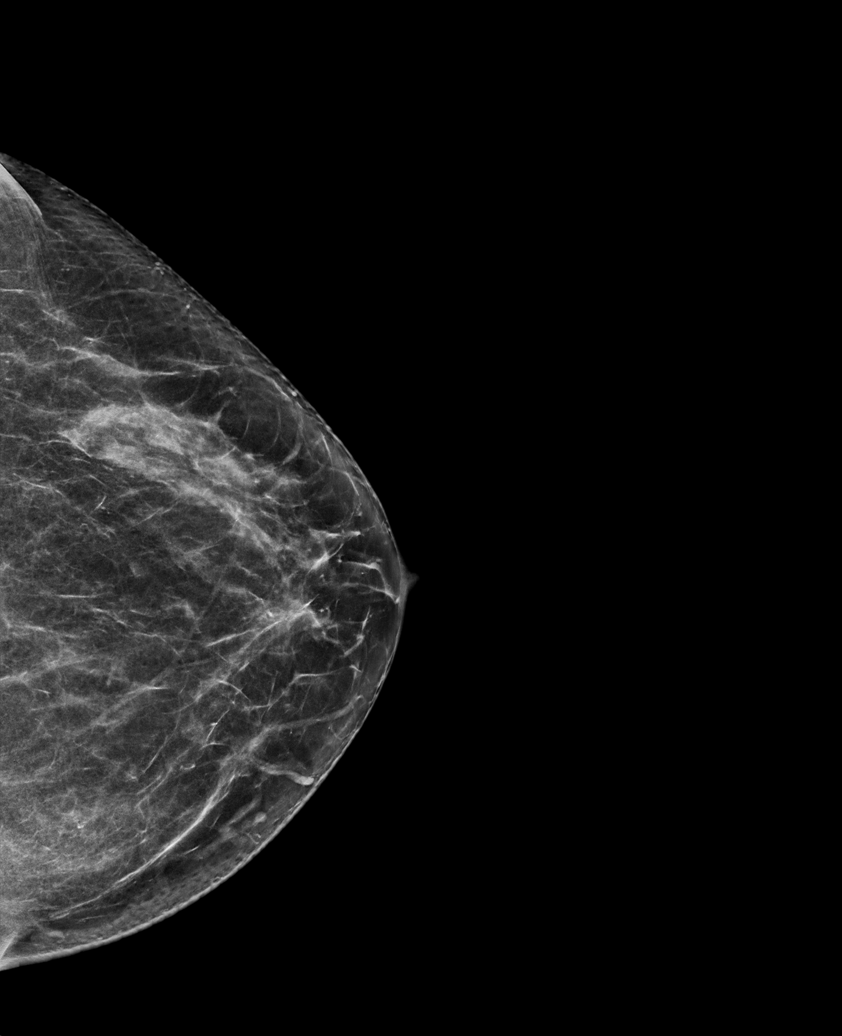

[R MLO synth-2D (1 of 2)]
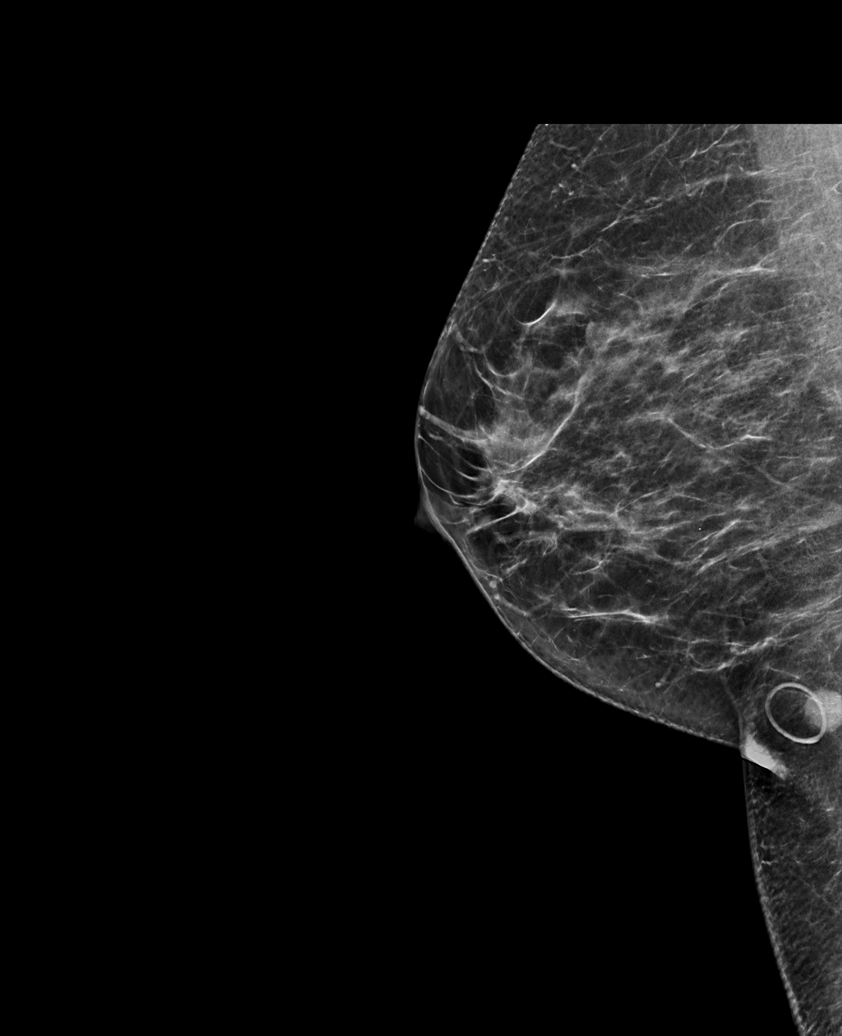

[R MLO synth-2D (2 of 2)]
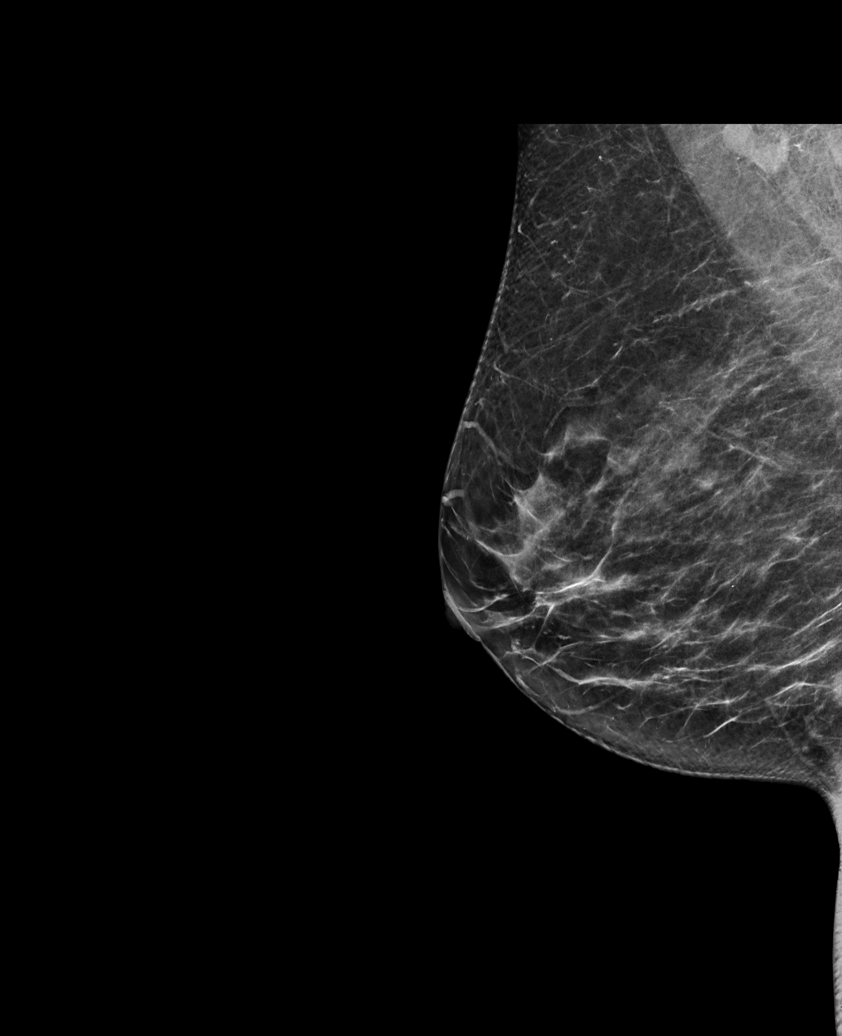

[R MLO tomo · tomo slice 34/67.0]
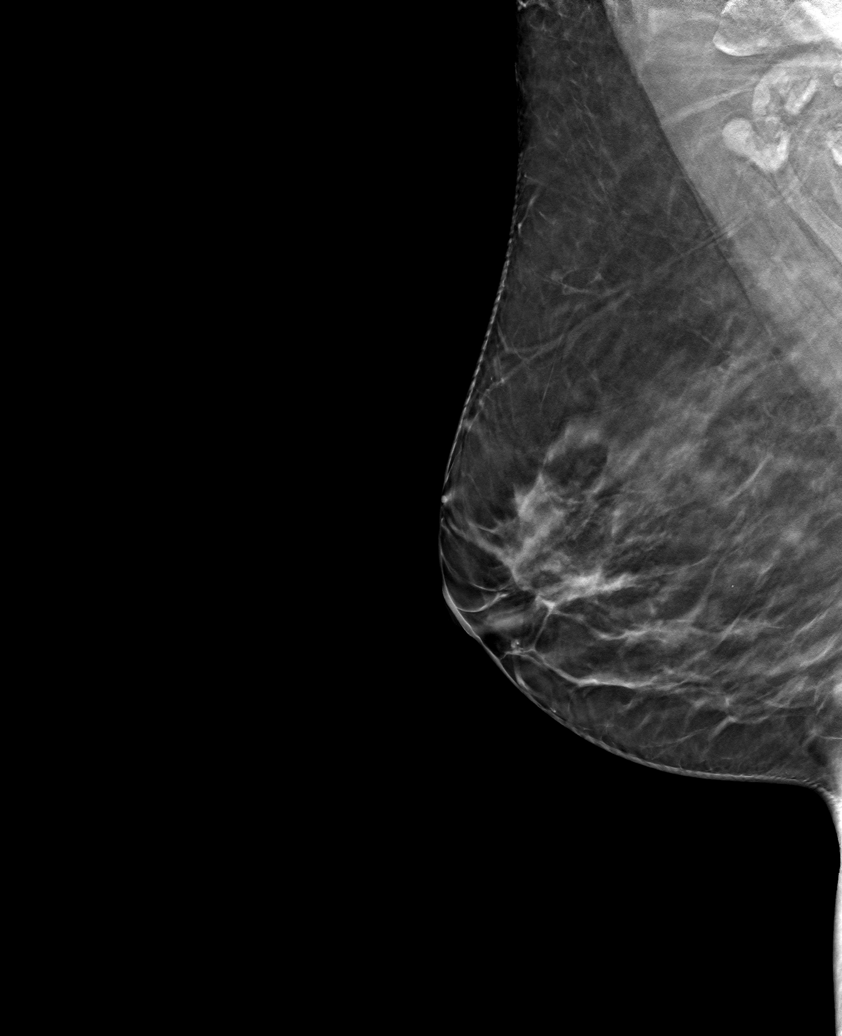

[6 of 30 positions shown; findings below may reference images not displayed]

ACR Breast Density Category c: The breast tissue is heterogeneously
dense, which may obscure small masses.
FINDINGS: A rounded, circumscribed superficial mass in the far medial right
breast posteriorly is slightly denser and has otherwise not changed
significantly. No interval findings suspicious for malignancy in
either breast.

On physical exam, the patient has an approximately 1.2 cm oval,
circumscribed, palpable mass within or just beneath the skin in the
4 o'clock position of the right breast, 10 cm from the nipple. There
is a small overlying skin punctum.

Targeted ultrasound is performed, showing a 1.1 x 1.0 x 0.8 cm oval,
horizontally oriented, circumscribed, predominantly hypoechoic mass
in the posterior layer of the dermis and underlying breast tissue in
the 4 o'clock position of the right breast, 10 cm from the nipple.
There is a small tract extending to the skin. No internal blood flow
was seen power Doppler. This measured 0.8 x 0.8 x 0.5 cm on
11/24/2019.
IMPRESSION: 1. Mild increase in size of a benign sebaceous cyst in the 4 o'clock
position of the right breast.
2. No evidence of malignancy in either breast.

RECOMMENDATION:
Bilateral screening mammogram in 1 year.

I have discussed the findings and recommendations with the patient.
If applicable, a reminder letter will be sent to the patient
regarding the next appointment.

BI-RADS CATEGORY  2: Benign.

## 2022-05-17 ENCOUNTER — Encounter: Payer: Self-pay | Admitting: Physician Assistant

## 2022-05-17 ENCOUNTER — Ambulatory Visit (INDEPENDENT_AMBULATORY_CARE_PROVIDER_SITE_OTHER): Payer: Commercial Managed Care - PPO | Admitting: Physician Assistant

## 2022-05-17 VITALS — BP 120/80 | HR 77 | Temp 97.5°F | Ht 58.75 in | Wt 159.5 lb

## 2022-05-17 DIAGNOSIS — E669 Obesity, unspecified: Secondary | ICD-10-CM | POA: Diagnosis not present

## 2022-05-17 DIAGNOSIS — D500 Iron deficiency anemia secondary to blood loss (chronic): Secondary | ICD-10-CM | POA: Diagnosis not present

## 2022-05-17 DIAGNOSIS — Z23 Encounter for immunization: Secondary | ICD-10-CM

## 2022-05-17 DIAGNOSIS — Z0000 Encounter for general adult medical examination without abnormal findings: Secondary | ICD-10-CM | POA: Diagnosis not present

## 2022-05-17 DIAGNOSIS — G4700 Insomnia, unspecified: Secondary | ICD-10-CM | POA: Insufficient documentation

## 2022-05-17 DIAGNOSIS — Z114 Encounter for screening for human immunodeficiency virus [HIV]: Secondary | ICD-10-CM | POA: Diagnosis not present

## 2022-05-17 DIAGNOSIS — Z136 Encounter for screening for cardiovascular disorders: Secondary | ICD-10-CM | POA: Diagnosis not present

## 2022-05-17 DIAGNOSIS — Z1159 Encounter for screening for other viral diseases: Secondary | ICD-10-CM | POA: Diagnosis not present

## 2022-05-17 DIAGNOSIS — L700 Acne vulgaris: Secondary | ICD-10-CM | POA: Insufficient documentation

## 2022-05-17 DIAGNOSIS — R87619 Unspecified abnormal cytological findings in specimens from cervix uteri: Secondary | ICD-10-CM | POA: Insufficient documentation

## 2022-05-17 DIAGNOSIS — Z8659 Personal history of other mental and behavioral disorders: Secondary | ICD-10-CM

## 2022-05-17 DIAGNOSIS — Z13220 Encounter for screening for lipoid disorders: Secondary | ICD-10-CM

## 2022-05-17 LAB — COMPREHENSIVE METABOLIC PANEL
ALT: 17 U/L (ref 0–35)
AST: 15 U/L (ref 0–37)
Albumin: 4 g/dL (ref 3.5–5.2)
Alkaline Phosphatase: 69 U/L (ref 39–117)
BUN: 14 mg/dL (ref 6–23)
CO2: 28 mEq/L (ref 19–32)
Calcium: 10.3 mg/dL (ref 8.4–10.5)
Chloride: 108 mEq/L (ref 96–112)
Creatinine, Ser: 0.79 mg/dL (ref 0.40–1.20)
GFR: 85.34 mL/min (ref >60.00)
Glucose, Bld: 90 mg/dL (ref 70–99)
Potassium: 4.1 mEq/L (ref 3.5–5.1)
Sodium: 143 mEq/L (ref 135–145)
Total Bilirubin: 0.4 mg/dL (ref 0.2–1.2)
Total Protein: 6.8 g/dL (ref 6.0–8.3)

## 2022-05-17 LAB — CBC WITH DIFFERENTIAL/PLATELET
Basophils Absolute: 0 10*3/uL (ref 0.0–0.1)
Basophils Relative: 0.2 % (ref 0.0–3.0)
Eosinophils Absolute: 0 10*3/uL (ref 0.0–0.7)
Eosinophils Relative: 0.7 % (ref 0.0–5.0)
HCT: 39.5 % (ref 36.0–46.0)
Hemoglobin: 13.3 g/dL (ref 12.0–15.0)
Lymphocytes Relative: 39.1 % (ref 12.0–46.0)
Lymphs Abs: 2.4 10*3/uL (ref 0.7–4.0)
MCHC: 33.6 g/dL (ref 30.0–36.0)
MCV: 87.4 fl (ref 78.0–100.0)
Monocytes Absolute: 0.6 10*3/uL (ref 0.1–1.0)
Monocytes Relative: 9.1 % (ref 3.0–12.0)
Neutro Abs: 3.2 10*3/uL (ref 1.4–7.7)
Neutrophils Relative %: 50.9 % (ref 43.0–77.0)
Platelets: 238 10*3/uL (ref 150.0–400.0)
RBC: 4.52 Mil/uL (ref 3.87–5.11)
RDW: 14.4 % (ref 11.5–15.5)
WBC: 6.2 10*3/uL (ref 4.0–10.5)

## 2022-05-17 LAB — LIPID PANEL
Cholesterol: 189 mg/dL (ref 0–200)
HDL: 46.5 mg/dL (ref >39.00)
LDL Cholesterol: 109 mg/dL — ABNORMAL HIGH (ref 0–99)
NonHDL: 142.15
Total CHOL/HDL Ratio: 4
Triglycerides: 166 mg/dL — ABNORMAL HIGH (ref 0.0–149.0)
VLDL: 33.2 mg/dL (ref 0.0–40.0)

## 2022-05-17 NOTE — Progress Notes (Signed)
Subjective:    Rebecca Harris is a 52 y.o. female and is here to establish care.  HPI  Health Maintenance Due  Topic Date Due   Hepatitis C Screening  Never done   DTaP/Tdap/Td (1 - Tdap) Never done    Acute Concerns: None  Chronic Issues: Hx of depression Used to be on paxil but stopped after her divorce Has seen therapist in the past Denies SI/HI Feels well controlled  Iron deficiency anemia Has been instructed to take iron regularly by ob-gyn Takes daily without complaints of constipation, nausea, abd pain  Health Maintenance: Immunizations -- She is receiving the tetanus vaccine today.  Colonoscopy -- Last done 02/02/2019. Results were normal. Mammogram -- Last done 12/26/2021. Abnormal mass found medial right breast PAP -- UTD, requesting records Bone Density -- N/a Diet -- She has been eating healthy. She enjoys chips and salsa at times.  Exercise -- She has not been exercising regularly. 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   Sleep habits -- She denies snoring. Mood -- She has a history of depression. She previously was prescribed Zoloft   UTD with dentist? - yes UTD with eye doctor? - yes. Next appointment is in May.   Weight history: Wt Readings from Last 10 Encounters:  05/17/22 159 lb 8 oz (72.3 kg)  02/02/19 156 lb (70.8 kg)  01/28/19 156 lb (70.8 kg)  04/11/14 152 lb (68.9 kg)   Body mass index is 32.49 kg/m. Patient's last menstrual period was 05/07/2022 (exact date).  Alcohol use:  reports current alcohol use of about 2.0 standard drinks of alcohol per week.  Tobacco use:  Tobacco Use: Low Risk  (05/17/2022)   Patient History    Smoking Tobacco Use: Never    Smokeless Tobacco Use: Never    Passive Exposure: Not on file   Eligible for lung cancer screening? no     05/17/2022    8:12 AM  Depression screen PHQ 2/9  Decreased Interest 0  Down, Depressed, Hopeless 0  PHQ - 2 Score 0  Altered sleeping 0  Tired,  decreased energy 1  Change in appetite 0  Feeling bad or failure about yourself  0  Trouble concentrating 0  Moving slowly or fidgety/restless 0  Suicidal thoughts 0  PHQ-9 Score 1  Difficult doing work/chores Not difficult at all     Other providers/specialists: Patient Care Team: Inda Coke, [place] as PCP - General (Physician Assistant)    PMHx, SurgHx, SocialHx, Medications, and Allergies were reviewed in the Visit Navigator and updated as appropriate.   Past Medical History:  Diagnosis Date   Depression      Past Surgical History:  Procedure Laterality Date   CESAREAN SECTION     x 3    FOOT SURGERY  2017   hammertoe     Family History  Problem Relation Age of Onset   Colon cancer Neg Hx    Colon polyps Neg Hx    Esophageal cancer Neg Hx    Rectal cancer Neg Hx    Stomach cancer Neg Hx    Breast cancer Neg Hx     Social History   Tobacco Use   Smoking status: Never   Smokeless tobacco: Never  Vaping Use   Vaping Use: Never used  Substance Use Topics   Alcohol use: Yes    Alcohol/week: 2.0 standard drinks of alcohol    Types: 2 Glasses of wine per week  Comment: occ   Drug use: Never    Review of Systems:   Review of Systems  Constitutional:  Negative for chills, fever, malaise/fatigue and weight loss.  HENT:  Negative for hearing loss, sinus pain and sore throat.   Respiratory:  Negative for cough and hemoptysis.   Cardiovascular:  Negative for chest pain, palpitations, leg swelling and PND.  Gastrointestinal:  Negative for abdominal pain, constipation, diarrhea, heartburn, nausea and vomiting.  Genitourinary:  Negative for dysuria, frequency and urgency.  Musculoskeletal:  Negative for back pain, myalgias and neck pain.  Skin:  Negative for itching and rash.  Neurological:  Negative for dizziness, tingling, seizures and headaches.  Endo/Heme/Allergies:  Negative for polydipsia.  Psychiatric/Behavioral:  Negative for depression. The  patient is not nervous/anxious.     Objective:   BP 120/80 (BP Location: Left Arm, Patient Position: Sitting, Cuff Size: Normal)   Pulse 77   Temp (!) 97.5 F (36.4 C) (Temporal)   Ht 4' 10.75" (1.492 m)   Wt 159 lb 8 oz (72.3 kg)   LMP 05/07/2022 (Exact Date)   SpO2 97%   BMI 32.49 kg/m  Body mass index is 32.49 kg/m.   General Appearance:    Alert, cooperative, no distress, appears stated age  Head:    Normocephalic, without obvious abnormality, atraumatic  Eyes:    PERRL, conjunctiva/corneas clear, EOM's intact, fundi    benign, both eyes  Ears:    Normal TM's and external ear canals, both ears  Nose:   Nares normal, septum midline, mucosa normal, no drainage    or sinus tenderness  Throat:   Lips, mucosa, and tongue normal; teeth and gums normal  Neck:   Supple, symmetrical, trachea midline, no adenopathy;    thyroid:  no enlargement/tenderness/nodules; no carotid   bruit or JVD  Back:     Symmetric, no curvature, ROM normal, no CVA tenderness  Lungs:     Clear to auscultation bilaterally, respirations unlabored  Chest Wall:    No tenderness or deformity   Heart:    Regular rate and rhythm, S1 and S2 normal, no murmur, rub or gallop  Breast Exam:    Deferred   Abdomen:     Soft, non-tender, bowel sounds active all four quadrants,    no masses, no organomegaly  Genitalia:    Deferred  Extremities:   Extremities normal, atraumatic, no cyanosis or edema  Pulses:   2+ and symmetric all extremities  Skin:   Skin color, texture, turgor normal, no rashes or lesions  Lymph nodes:   Cervical, supraclavicular, and axillary nodes normal  Neurologic:   CNII-XII intact, normal strength, sensation and reflexes    throughout    Assessment/Plan:   Routine physical examination Today patient counseled on age appropriate routine health concerns for screening and prevention, each reviewed and up to date or declined. Immunizations reviewed and up to date or declined. Labs ordered and  reviewed. Risk factors for depression reviewed and negative. Hearing function and visual acuity are intact. ADLs screened and addressed as needed. Functional ability and level of safety reviewed and appropriate. Education, counseling and referrals performed based on assessed risks today. Patient provided with a copy of personalized plan for preventive services.  Obesity, unspecified classification, unspecified obesity type, unspecified whether serious comorbidity present Continue efforts at healthy lifestyle Encouraged patient to start walking  History of depression No red flags Denies SI/HI Feels well controlled currently Follow-up as needed  Encounter for screening for other viral diseases Update  Hep C  Screening for HIV (human immunodeficiency virus) Update HIV  Encounter for lipid screening for cardiovascular disease Update lipid panel  Iron deficiency anemia due to chronic blood loss Update iron panel and make recommendations accordingly Asymptomatic and tolerating iron supplementation well  I,Rachel Rivera,acting as a scribe for [employer], PA.,have documented all relevant documentation on the behalf of Inda Coke, PA,as directed by  Inda Coke, PA while in the presence of Inda Coke, [place].  I, Inda Coke, [place], have reviewed all documentation for this visit. The documentation on 05/17/22 for the exam, diagnosis, procedures, and orders are all accurate and complete.  Inda Coke, PA-C [place]

## 2022-05-17 NOTE — Addendum Note (Signed)
Addended by: Marian Sorrow on: 05/17/2022 09:16 AM   Modules accepted: Orders

## 2022-05-17 NOTE — Patient Instructions (Addendum)
It was great to see you! ? ?Please go to the lab for blood work.  ? ?Our office will call you with your results unless you have chosen to receive results via MyChart. ? ?If your blood work is normal we will follow-up each year for physicals and as scheduled for chronic medical problems. ? ?If anything is abnormal we will treat accordingly and get you in for a follow-up. ? ?Take care, ? ?Rebecca Harris ?  ? ? ?

## 2022-05-18 LAB — HIV ANTIBODY (ROUTINE TESTING W REFLEX): HIV 1&2 Ab, 4th Generation: NONREACTIVE

## 2022-05-18 LAB — HEPATITIS C ANTIBODY: Hepatitis C Ab: NONREACTIVE

## 2022-05-31 ENCOUNTER — Encounter: Payer: Self-pay | Admitting: Physician Assistant

## 2022-07-12 DIAGNOSIS — H52203 Unspecified astigmatism, bilateral: Secondary | ICD-10-CM | POA: Diagnosis not present

## 2022-09-05 DIAGNOSIS — D225 Melanocytic nevi of trunk: Secondary | ICD-10-CM | POA: Diagnosis not present

## 2022-09-05 DIAGNOSIS — I821 Thrombophlebitis migrans: Secondary | ICD-10-CM | POA: Diagnosis not present

## 2022-11-09 DIAGNOSIS — Z780 Asymptomatic menopausal state: Secondary | ICD-10-CM | POA: Diagnosis not present

## 2022-11-09 DIAGNOSIS — Z1231 Encounter for screening mammogram for malignant neoplasm of breast: Secondary | ICD-10-CM | POA: Diagnosis not present

## 2022-11-09 DIAGNOSIS — Z01419 Encounter for gynecological examination (general) (routine) without abnormal findings: Secondary | ICD-10-CM | POA: Diagnosis not present

## 2022-11-09 DIAGNOSIS — Z1389 Encounter for screening for other disorder: Secondary | ICD-10-CM | POA: Diagnosis not present

## 2023-02-11 DIAGNOSIS — J209 Acute bronchitis, unspecified: Secondary | ICD-10-CM | POA: Diagnosis not present

## 2023-09-14 ENCOUNTER — Ambulatory Visit (HOSPITAL_BASED_OUTPATIENT_CLINIC_OR_DEPARTMENT_OTHER)
Admission: EM | Admit: 2023-09-14 | Discharge: 2023-09-14 | Disposition: A | Discharge status: Home / Self Care | Attending: Family Medicine | Admitting: Family Medicine

## 2023-09-14 ENCOUNTER — Ambulatory Visit (INDEPENDENT_AMBULATORY_CARE_PROVIDER_SITE_OTHER)
Admit: 2023-09-14 | Discharge: 2023-09-14 | Disposition: A | Discharge status: Home / Self Care | Payer: Worker's Compensation | Attending: Family Medicine | Admitting: Family Medicine

## 2023-09-14 ENCOUNTER — Encounter (HOSPITAL_BASED_OUTPATIENT_CLINIC_OR_DEPARTMENT_OTHER): Payer: Self-pay

## 2023-09-14 DIAGNOSIS — M79671 Pain in right foot: Secondary | ICD-10-CM

## 2023-09-14 DIAGNOSIS — S92354A Nondisplaced fracture of fifth metatarsal bone, right foot, initial encounter for closed fracture: Secondary | ICD-10-CM | POA: Diagnosis not present

## 2023-09-14 DIAGNOSIS — W19XXXA Unspecified fall, initial encounter: Secondary | ICD-10-CM

## 2023-09-14 DIAGNOSIS — S92351A Displaced fracture of fifth metatarsal bone, right foot, initial encounter for closed fracture: Secondary | ICD-10-CM | POA: Diagnosis not present

## 2023-09-14 NOTE — Discharge Instructions (Signed)
 You have a fracture of the 5th metatarsal. We are placing you in a boot. Rest, stay off as much as possible. Ice the foot. You can take Ibuprofen for pain as needed.  Call [facility] on Monday.

## 2023-09-14 NOTE — ED Triage Notes (Signed)
 Tripped in parking lot on Thursday while at work. Right foot pain. States has been walking on foot and + swelling and increased pain.

## 2023-09-15 NOTE — ED Provider Notes (Signed)
 [facility]    CSN: [id number] Arrival date & time: 09/14/23  1147      History   Chief Complaint Chief Complaint  Patient presents with   Foot Pain    HPI Rebecca Harris is a 53 y.o. female.   Patient is a 54 year old female presents today with right foot pain.  Reports that she tripped in the parking lot on her way into work Thursday.  Twisting the right foot.  Initially the foot was kind of sore and not really that painful but has got worse over the last few days.  She has swelling to the right lateral foot area.  Painful to walk.  Denies any numbness, tingling.   Foot Pain    Past Medical History:  Diagnosis Date   Depression     Patient Active Problem List   Diagnosis Date Noted   Abnormal cervical Papanicolaou smear 05/17/2022   Acne vulgaris 05/17/2022   History of depression 05/17/2022   Insomnia 05/17/2022   Obesity 05/17/2022    Past Surgical History:  Procedure Laterality Date   CESAREAN SECTION     x 3    FOOT SURGERY  2017   hammertoe    OB History   No obstetric history on file.      Home Medications    Prior to Admission medications   Medication Sig Start Date End Date Taking? Authorizing Provider  Collagen Hydrolysate POWD 3 Scoops by Does not apply route daily in the afternoon.    [provider]  ELDERBERRY PO Take by mouth daily in the afternoon. One teaspoon    [provider]  Ferrous Sulfate (IRON) 325 (65 Fe) MG TABS Take 1 tablet by mouth daily in the afternoon.    [provider]  Multiple Vitamin (MULTIVITAMIN) tablet Take 1 tablet by mouth daily.    [provider]    Family History Family History  Problem Relation Age of Onset   Hypertension Mother    COPD Mother    Depression Mother    Hypertension Maternal Grandmother    Diabetes Maternal Grandmother    Heart attack Maternal Grandmother    Heart disease Maternal Grandmother    Hypertension Maternal Grandfather     Diabetes Maternal Grandfather    Alcohol abuse Son    Drug abuse Son    Mental illness Daughter    Colon cancer Neg Hx    Colon polyps Neg Hx    Esophageal cancer Neg Hx    Rectal cancer Neg Hx    Stomach cancer Neg Hx    Breast cancer Neg Hx     Social History Social History   Tobacco Use   Smoking status: Never   Smokeless tobacco: Never  Vaping Use   Vaping status: Never Used  Substance Use Topics   Alcohol use: Yes    Alcohol/week: 2.0 standard drinks of alcohol    Types: 2 Glasses of wine per week    Comment: occ   Drug use: Never     Allergies   Phenergan [promethazine hcl]   Review of Systems Review of Systems See HPI  Physical Exam Triage Vital Signs ED Triage Vitals  Encounter Vitals Group     BP 09/14/23 1320 123/78     Girls Systolic BP Percentile --      Girls Diastolic BP Percentile --      Boys Systolic BP Percentile --      Boys Diastolic BP Percentile --  Pulse Rate 09/14/23 1320 76     Resp 09/14/23 1320 20     Temp 09/14/23 1320 97.9 F (36.6 C)     Temp Source 09/14/23 1320 Oral     SpO2 09/14/23 1320 97 %     Weight --      Height --      Head Circumference --      Peak Flow --      Pain Score 09/14/23 1322 5     Pain Loc --      Pain Education --      Exclude from Growth Chart --    No data found.  Updated Vital Signs BP 123/78 (BP Location: Right Arm)   Pulse 76   Temp 97.9 F (36.6 C) (Oral)   Resp 20   SpO2 97%   Visual Acuity Right Eye Distance:   Left Eye Distance:   Bilateral Distance:    Right Eye Near:   Left Eye Near:    Bilateral Near:     Physical Exam Vitals and nursing note reviewed.  Constitutional:      General: She is not in acute distress.    Appearance: Normal appearance. She is not ill-appearing, toxic-appearing or diaphoretic.  Pulmonary:     Effort: Pulmonary effort is normal.  Musculoskeletal:        General: Swelling and tenderness present.     Comments: Swelling, tenderness to  right lateral foot base of fifth metatarsal area  Skin:    General: Skin is warm and dry.  Neurological:     Mental Status: She is alert.  Psychiatric:        Mood and Affect: Mood normal.      UC Treatments / Results  Labs (all labs ordered are listed, but only abnormal results are displayed) Labs Reviewed - No data to display  EKG   Radiology DG Foot Complete Right Result Date: 09/14/2023 CLINICAL DATA:  Trauma.  Tripped and fell EXAM: RIGHT FOOT COMPLETE - 3+ VIEW COMPARISON:  None Available. FINDINGS: Horizontal fracture through the base of the fifth metatarsal. No dislocation IMPRESSION: Fracture of the base of the fifth metatarsal Electronically Signed   By: Jackquline Boxer M.D.   On: 09/14/2023 14:07    Procedures Procedures (including critical care time)  Medications Ordered in UC Medications - No data to display  Initial Impression / Assessment and Plan / UC Course  I have reviewed the triage vital signs and the nursing notes.  Pertinent labs & imaging results that were available during my care of the patient were reviewed by me and considered in my medical decision making (see chart for details).     Fifth metatarsal fracture of the right foot-patient placed in cam walker boot here for stabilization and support.  Recommend rest, ice, elevate.  She can take ibuprofen for pain as needed. Call [facility] on Monday for follow-up Final Clinical Impressions(s) / UC Diagnoses   Final diagnoses:  Closed nondisplaced fracture of fifth metatarsal bone of right foot, initial encounter     Discharge Instructions      You have a fracture of the 5th metatarsal. We are placing you in a boot. Rest, stay off as much as possible. Ice the foot. You can take Ibuprofen for pain as needed.  Call [facility] on Monday.    ED Prescriptions   None    PDMP not reviewed this encounter.   Adah Wilbert LABOR, FNP 09/15/23 [phone]

## 2023-09-19 DIAGNOSIS — S92354A Nondisplaced fracture of fifth metatarsal bone, right foot, initial encounter for closed fracture: Secondary | ICD-10-CM | POA: Diagnosis not present

## 2023-11-12 DIAGNOSIS — Z01419 Encounter for gynecological examination (general) (routine) without abnormal findings: Secondary | ICD-10-CM | POA: Diagnosis not present

## 2023-11-12 DIAGNOSIS — N951 Menopausal and female climacteric states: Secondary | ICD-10-CM | POA: Diagnosis not present

## 2023-11-12 DIAGNOSIS — Z130 Encounter for screening for diseases of the blood and blood-forming organs and certain disorders involving the immune mechanism: Secondary | ICD-10-CM | POA: Diagnosis not present

## 2023-11-12 DIAGNOSIS — Z1231 Encounter for screening mammogram for malignant neoplasm of breast: Secondary | ICD-10-CM | POA: Diagnosis not present

## 2023-11-12 DIAGNOSIS — Z1389 Encounter for screening for other disorder: Secondary | ICD-10-CM | POA: Diagnosis not present

## 2023-11-12 LAB — HM MAMMOGRAPHY

## 2023-11-19 ENCOUNTER — Encounter: Payer: Self-pay | Admitting: Physician Assistant

## 2023-11-21 ENCOUNTER — Encounter: Payer: Self-pay | Admitting: Obstetrics and Gynecology

## 2024-02-26 ENCOUNTER — Encounter: Payer: Self-pay | Admitting: Physician Assistant

## 2024-02-26 ENCOUNTER — Ambulatory Visit: Admitting: Physician Assistant

## 2024-02-26 VITALS — BP 122/84 | HR 79 | Temp 97.9°F | Ht 58.75 in | Wt 168.0 lb

## 2024-02-26 DIAGNOSIS — L0291 Cutaneous abscess, unspecified: Secondary | ICD-10-CM

## 2024-02-26 MED ORDER — DOXYCYCLINE HYCLATE 100 MG PO TABS: 100.0000 mg | ORAL_TABLET | Freq: Two times a day (BID) | ORAL | 0 refills | Status: AC

## 2024-02-26 NOTE — Progress Notes (Signed)
 History of Present Illness:   Chief Complaint  Patient presents with   Cyst    Pt c/o cyst on her tailbone, started on Friday evening, area is red hard and painful, did start draining a little this morning.    Discussed the use of AI scribe software for clinical note transcription with the patient, who gave verbal consent to proceed.  History of Present Illness   Rebecca Harris is a 53 year old female who presents with a painful draining lesion, likely a pilonidal cyst.  She reports a very painful lesion in the sacrococcygeal area that began draining slightly this morning with a very foul odor. This is her first episode of a suspected pilonidal cyst. She has no known allergies to lidocaine, epinephrine, betadine, or iodine.     Reports there is no fevers/chills/n/v.    Past Medical History:  Diagnosis Date   Depression      Social History[1]  Past Surgical History:  Procedure Laterality Date   CESAREAN SECTION     x 3    FOOT SURGERY  2017   [email or web address]    Family History  Problem Relation Age of Onset   Hypertension Mother    COPD Mother    Depression Mother    Hypertension Maternal Grandmother    Diabetes Maternal Grandmother    Heart attack Maternal Grandmother    Heart disease Maternal Grandmother    Hypertension Maternal Grandfather    Diabetes Maternal Grandfather    Alcohol abuse Son    Drug abuse Son    Mental illness Daughter    Colon cancer Neg Hx    Colon polyps Neg Hx    Esophageal cancer Neg Hx    Rectal cancer Neg Hx    Stomach cancer Neg Hx    Breast cancer Neg Hx     Allergies[2]  Current Medications:  Current Medications[3]   Review of Systems:   Negative unless otherwise specified per HPI.  Vitals:   Vitals:   02/26/24 0957  BP: 122/84  Pulse: 79  Temp: 97.9 F (36.6 C)  TempSrc: Temporal  SpO2: 96%  Weight: 168 lb (76.2 kg)  Height: 4' 10.75 (1.492 m)     Body mass index is 34.22 kg/m.  Physical Exam:    Physical Exam Constitutional:      Appearance: Normal appearance. She is well-developed.  HENT:     Head: Normocephalic and atraumatic.  Eyes:     General: Lids are normal.     Extraocular Movements: Extraocular movements intact.     Conjunctiva/sclera: Conjunctivae normal.  Pulmonary:     Effort: Pulmonary effort is normal.  Musculoskeletal:        General: Normal range of motion.     Cervical back: Normal range of motion and neck supple.  Skin:    General: Skin is warm and dry.      Neurological:     Mental Status: She is alert and oriented to person, place, and time.  Psychiatric:        Attention and Perception: Attention and perception normal.        Mood and Affect: Mood normal.        Behavior: Behavior normal.        Thought Content: Thought content normal.        Judgment: Judgment normal.    Procedure: I&D  Consent: Risks and benefits of therapy discussed with patient who voices understanding and agrees with planned care. No barriers to  communication or understanding identified.  After obtaining informed consent, the patient's identity, procedure, and site were verified during a pause prior to proceeding with the minor surgical procedure as per universal protocol recommendations. Pt aware of risks not limited to but including infection, bleeding, damage to near by organs.  Meds, vitals, and allergies reviewed.  Indication: suspected abscess Pt complaints of: erythema, pain, swelling Location: right buttock Size: 8 cm x 5 cm  Prep: etoh/betadine Anesthesia: 1%lidocaine with epi Incision made with #11 blade Wound explored and loculations removed  Tolerated well without significant blood loss or obvious complications No cautery required Sterilized bandage applied to affected area  Aftercare, including wound care, risks of bleeding and infection were discussed. All questions answered.  Return for wound check as needed for further evaluation and management.    Assessment and Plan:   Assessment and Plan    Abscess No red flags or systemic symptom(s) Incision and drainage performed without complication Start doxycycline   Start sitz baths and as needed NSAIDs Follow up precautions advised     Lucie Buttner, PA-C    [1]  Social History Tobacco Use   Smoking status: Never   Smokeless tobacco: Never  Vaping Use   Vaping status: Never Used  Substance Use Topics   Alcohol use: Yes    Alcohol/week: 2.0 standard drinks of alcohol    Types: 2 Glasses of wine per week    Comment: occ   Drug use: Never  [2]  Allergies Allergen Reactions   Phenergan [Promethazine Hcl] Other (See Comments)    Hallucinate   [3]  Current Outpatient Medications:    BLACK COHOSH ROOT PO, Take 1 capsule by mouth daily in the afternoon., Disp: , Rfl:    Cholecalciferol 125 MCG (5000 UT) TABS, Take 1 tablet by mouth daily in the afternoon., Disp: , Rfl:    Collagen Hydrolysate POWD, 3 Scoops by Does not apply route daily in the afternoon., Disp: , Rfl:    doxycycline  (VIBRA -TABS) 100 MG tablet, Take 1 tablet (100 mg total) by mouth 2 (two) times daily., Disp: 14 tablet, Rfl: 0   ELDERBERRY PO, Take by mouth daily in the afternoon. One teaspoon, Disp: , Rfl:    Ferrous Sulfate (IRON) 325 (65 Fe) MG TABS, Take 1 tablet by mouth daily in the afternoon., Disp: , Rfl:    Ginkgo Biloba 40 MG CAPS, Take 1 capsule by mouth daily in the afternoon., Disp: , Rfl:    MAGNESIUM GLYCINATE PO, Take 1 tablet by mouth daily in the afternoon., Disp: , Rfl:    Multiple Vitamin (MULTIVITAMIN) tablet, Take 1 tablet by mouth daily., Disp: , Rfl:    Red Yeast Rice Extract 600 MG CAPS, Take 1 capsule by mouth daily in the afternoon., Disp: , Rfl:    Zinc 20 MG CAPS, Take 1 capsule by mouth daily in the afternoon., Disp: , Rfl:

## 2024-02-26 NOTE — Patient Instructions (Signed)
 Start doxycycline   Keep covered with ointment and bandage  Take NSAIDs such as motrin to reduce swelling/inflammation  IF any new symptom(s) or concerns, let me know
